# Patient Record
Sex: Male | Born: 1970 | Race: White | Hispanic: No | Marital: Married | Smoking: Never smoker
Health system: Southern US, Community
[De-identification: ages and names within clinical notes are randomized; demographics above are authoritative.]

## PROBLEM LIST (undated history)

## (undated) DIAGNOSIS — E878 Other disorders of electrolyte and fluid balance, not elsewhere classified: Secondary | ICD-10-CM

---

## 2013-09-07 DIAGNOSIS — E785 Hyperlipidemia, unspecified: Secondary | ICD-10-CM | POA: Insufficient documentation

## 2015-03-25 ENCOUNTER — Emergency Department
Admission: EM | Admit: 2015-03-25 | Discharge: 2015-03-25 | Disposition: A | Discharge status: Home / Self Care | Payer: BLUE CROSS/BLUE SHIELD | Attending: Emergency Medicine | Admitting: Emergency Medicine

## 2015-03-25 ENCOUNTER — Emergency Department: Payer: BLUE CROSS/BLUE SHIELD

## 2015-03-25 ENCOUNTER — Encounter: Payer: Self-pay | Admitting: Emergency Medicine

## 2015-03-25 DIAGNOSIS — R002 Palpitations: Secondary | ICD-10-CM | POA: Diagnosis present

## 2015-03-25 DIAGNOSIS — I493 Ventricular premature depolarization: Secondary | ICD-10-CM | POA: Insufficient documentation

## 2015-03-25 HISTORY — DX: Other disorders of electrolyte and fluid balance, not elsewhere classified: E87.8

## 2015-03-25 LAB — COMPREHENSIVE METABOLIC PANEL
ALBUMIN: 4.8 g/dL (ref 3.5–5.0)
ALT: 44 U/L (ref 17–63)
AST: 28 U/L (ref 15–41)
Alkaline Phosphatase: 98 U/L (ref 38–126)
Anion gap: 6 (ref 5–15)
BILIRUBIN TOTAL: 0.9 mg/dL (ref 0.3–1.2)
BUN: 16 mg/dL (ref 6–20)
CHLORIDE: 103 mmol/L (ref 101–111)
CO2: 29 mmol/L (ref 22–32)
Calcium: 9.5 mg/dL (ref 8.9–10.3)
Creatinine, Ser: 0.8 mg/dL (ref 0.61–1.24)
GFR calc Af Amer: >60 mL/min (ref >60)
GFR calc non Af Amer: >60 mL/min (ref >60)
GLUCOSE: 94 mg/dL (ref 65–99)
POTASSIUM: 3.8 mmol/L (ref 3.5–5.1)
SODIUM: 138 mmol/L (ref 135–145)
Total Protein: 7.5 g/dL (ref 6.5–8.1)

## 2015-03-25 LAB — CBC WITH DIFFERENTIAL/PLATELET
BASOS ABS: 0 10*3/uL (ref 0–0.1)
BASOS PCT: 0 %
EOS ABS: 0.1 10*3/uL (ref 0–0.7)
Eosinophils Relative: 1 %
HEMATOCRIT: 44.5 % (ref 40.0–52.0)
Hemoglobin: 15.3 g/dL (ref 13.0–18.0)
Lymphocytes Relative: 19 %
Lymphs Abs: 1.3 10*3/uL (ref 1.0–3.6)
MCH: 30.2 pg (ref 26.0–34.0)
MCHC: 34.3 g/dL (ref 32.0–36.0)
MCV: 88.1 fL (ref 80.0–100.0)
MONO ABS: 0.5 10*3/uL (ref 0.2–1.0)
Monocytes Relative: 8 %
NEUTROS ABS: 4.9 10*3/uL (ref 1.4–6.5)
Neutrophils Relative %: 72 %
PLATELETS: 242 10*3/uL (ref 150–440)
RBC: 5.05 MIL/uL (ref 4.40–5.90)
RDW: 13 % (ref 11.5–14.5)
WBC: 6.8 10*3/uL (ref 3.8–10.6)

## 2015-03-25 LAB — MAGNESIUM: Magnesium: 2.4 mg/dL (ref 1.7–2.4)

## 2015-03-25 LAB — TROPONIN I: Troponin I: <0.03 ng/mL (ref <0.031)

## 2015-03-25 LAB — TSH: TSH: 1.118 u[IU]/mL (ref 0.350–4.500)

## 2015-03-25 NOTE — ED Provider Notes (Signed)
Time Seen: Approximately 1650 I have reviewed the triage notes  Chief Complaint: Palpitations   History of Present Illness: Lawrence Morris is a 45 y.o. male who states that he noticed some heart palpitations last week and then they seemed to slow down over the weekend and then increased today again. He states it feels like he skips a beat. He denies any chest pain, shortness of breath, nausea, vomiting. He denies any arm, jaw, neck pain. He denies any feelings of lightheadedness or syncopal episode. No pulmonary emboli. He states he's been able do his normal day-to-day activities. He states he feels a little anxious but is not taking any medication at this time. He denies any heavy caffeine usage, cocaine, or any other cardiac stimulants at this time. He states he was seen and evaluated in his hometown for possible arrhythmia approximately 4 years ago. He states that evaluation overall was negative and the symptoms seemed to resolve at that time.   Past Medical History  Diagnosis Date  . Hyperchloremia     There are no active problems to display for this patient.   History reviewed. No pertinent past surgical history.  History reviewed. No pertinent past surgical history.  No current outpatient prescriptions on file.  Allergies:  Review of patient's allergies indicates no known allergies.  Family History: History reviewed. No pertinent family history.  Social History: Social History  Substance Use Topics  . Smoking status: Never Smoker   . Smokeless tobacco: None  . Alcohol Use: None     Review of Systems:   10 point review of systems was performed and was otherwise negative:  Constitutional: No fever Eyes: No visual disturbances ENT: No sore throat, ear pain Cardiac: No chest pain Respiratory: No shortness of breath, wheezing, or stridor Abdomen: No abdominal pain, no vomiting, No diarrhea Endocrine: No weight loss, No night sweats Extremities: No peripheral  edema, cyanosis Skin: No rashes, easy bruising Neurologic: No focal weakness, trouble with speech or swollowing Urologic: No dysuria, Hematuria, or urinary frequency   Physical Exam:  ED Triage Vitals  Enc Vitals Group     BP 03/25/15 1558 109/70 mmHg     Pulse Rate 03/25/15 1558 96     Resp 03/25/15 1558 18     Temp 03/25/15 1558 98.2 F (36.8 C)     Temp Source 03/25/15 1558 Oral     SpO2 03/25/15 1558 99 %     Weight 03/25/15 1558 205 lb (92.987 kg)     Height 03/25/15 1558 6\' 2"  (1.88 m)     Head Cir --      Peak Flow --      Pain Score 03/25/15 1600 0     Pain Loc --      Pain Edu? --      Excl. in [place]? --     General: Awake , Alert , and Oriented times 3; GCS 15 Head: Normal cephalic , atraumatic Eyes: Pupils equal , round, reactive to light Nose/Throat: No nasal drainage, patent upper airway without erythema or exudate.  Neck: Supple, Full range of motion, No anterior adenopathy or palpable thyroid masses Lungs: Clear to ascultation without wheezes , rhonchi, or rales Heart: Regular rate, regular rhythm without murmurs , gallops , or rubs Abdomen: Soft, non tender without rebound, guarding , or rigidity; bowel sounds positive and symmetric in all 4 quadrants. No organomegaly .        Extremities: 2 plus symmetric pulses. No edema, clubbing or  cyanosis Neurologic: normal ambulation, Motor symmetric without deficits, sensory intact Skin: warm, dry, no rashes   Labs:   All laboratory work was reviewed including any pertinent negatives or positives listed below:  Labs Reviewed  CBC WITH DIFFERENTIAL/PLATELET  COMPREHENSIVE METABOLIC PANEL  TROPONIN I  MAGNESIUM  TSH   reviewed the patient's laboratory work shows no significant abnormalities  EKG:  ED ECG REPORT I, Daymon Larsen, the attending physician, personally viewed and interpreted this ECG.  Date: 03/25/2015 EKG Time: 1601 Rate: 90 Rhythm: normal sinus rhythm with occasional PVCs QRS Axis:  normal Intervals: normal ST/T Wave abnormalities: normal Conduction Disutrbances: Nonspecified intraventricular delay Narrative Interpretation: unremarkable No Brugada's    Radiology: EXAM: CHEST 2 VIEW  COMPARISON: None.  FINDINGS: The cardiomediastinal contours are normal. There is an 12 mm nodular opacity in the perihilar right midlung zone. Pulmonary vasculature is normal. No consolidation, pleural effusion, or pneumothorax. No acute osseous abnormalities are seen.  IMPRESSION: 1. Right perihilar 12 mm nodular opacity. This may reflect a vessel on end, however is slightly more peripheral than to be expected, recommend chest CT to exclude underlying pulmonary nodule. 2. There is otherwise no acute process   I personally reviewed the radiologic studies     ED Course:  Patient was placed on a continuous cardiac monitor and has some occasional PVCs. He had one couplet while here in emergency department. He is asymptomatic outside of feeling the palpitations. We discussed beta blocker therapy but I felt more appropriate action would be a referral to a cardiologist for an outpatient echocardiogram. Patient's been advised drink plenty of fluids and avoid any stimulants. We discussed anti-anxiety medications etc. but we felt again the cardiology evaluation would be preferred. He is to be of understanding was advised to return here if he has any chest pain feelings of lightheadedness syncopal episode or any new concerns.   Assessment:  Heart palpitations Premature ventricular contractions  Final Clinical Impression:   Final diagnoses:  Heart palpitations     Plan:  Outpatient management Patient was advised to return immediately if condition worsens. Patient was advised to follow up with their primary care physician or other specialized physicians involved in their outpatient care            Daymon Larsen, MD 03/25/15 2000

## 2015-03-25 NOTE — Discharge Instructions (Signed)
Palpitations A palpitation is the feeling that your heartbeat is irregular or is faster than normal. It may feel like your heart is fluttering or skipping a beat. Palpitations are usually not a serious problem. However, in some cases, you may need further medical evaluation. CAUSES  Palpitations can be caused by:  Smoking.  Caffeine or other stimulants, such as diet pills or energy drinks.  Alcohol.  Stress and anxiety.  Strenuous physical activity.  Fatigue.  Certain medicines.  Heart disease, especially if you have a history of irregular heart rhythms (arrhythmias), such as atrial fibrillation, atrial flutter, or supraventricular tachycardia.  An improperly working pacemaker or defibrillator. DIAGNOSIS  To find the cause of your palpitations, your health care provider will take your medical history and perform a physical exam. Your health care provider may also have you take a test called an ambulatory electrocardiogram (ECG). An ECG records your heartbeat patterns over a 24-hour period. You may also have other tests, such as:  Transthoracic echocardiogram (TTE). During echocardiography, sound waves are used to evaluate how blood flows through your heart.  Transesophageal echocardiogram (TEE).  Cardiac monitoring. This allows your health care provider to monitor your heart rate and rhythm in real time.  Holter monitor. This is a portable device that records your heartbeat and can help diagnose heart arrhythmias. It allows your health care provider to track your heart activity for several days, if needed.  Stress tests by exercise or by giving medicine that makes the heart beat faster. TREATMENT  Treatment of palpitations depends on the cause of your symptoms and can vary greatly. Most cases of palpitations do not require any treatment other than time, relaxation, and monitoring your symptoms. Other causes, such as atrial fibrillation, atrial flutter, or supraventricular  tachycardia, usually require further treatment. HOME CARE INSTRUCTIONS   Avoid:  Caffeinated coffee, tea, soft drinks, diet pills, and energy drinks.  Chocolate.  Alcohol.  Stop smoking if you smoke.  Reduce your stress and anxiety. Things that can help you relax include:  A method of controlling things in your body, such as your heartbeats, with your mind (biofeedback).  Yoga.  Meditation.  Physical activity such as swimming, jogging, or walking.  Get plenty of rest and sleep. SEEK MEDICAL CARE IF:   You continue to have a fast or irregular heartbeat beyond 24 hours.  Your palpitations occur more often. SEEK IMMEDIATE MEDICAL CARE IF:  You have chest pain or shortness of breath.  You have a severe headache.  You feel dizzy or you faint. MAKE SURE YOU:  Understand these instructions.  Will watch your condition.  Will get help right away if you are not doing well or get worse.   This information is not intended to replace advice given to you by your health care provider. Make sure you discuss any questions you have with your health care provider.   Document Released: 02/28/2000 Document Revised: 03/07/2013 Document Reviewed: 05/01/2011 Elsevier Interactive Patient Education [employer].  Please return immediately if condition worsens. Please contact her primary physician or the physician you were given for referral. If you have any specialist physicians involved in her treatment and plan please also contact them. Thank you for using [facility].

## 2015-03-25 NOTE — ED Notes (Signed)
MD at bedside. 

## 2015-03-25 NOTE — ED Notes (Signed)
Reports palpatations last week that resolved.  Today started again. Denies chest pain. Mild sob, and diaphoresis.

## 2017-03-05 ENCOUNTER — Ambulatory Visit: Payer: Self-pay | Admitting: Adult Health

## 2017-03-05 ENCOUNTER — Encounter: Payer: Self-pay | Admitting: Adult Health

## 2017-03-05 VITALS — BP 130/80 | HR 99 | Temp 97.6°F | Resp 16 | Ht 74.0 in | Wt 212.0 lb

## 2017-03-05 DIAGNOSIS — J320 Chronic maxillary sinusitis: Secondary | ICD-10-CM

## 2017-03-05 DIAGNOSIS — Z808 Family history of malignant neoplasm of other organs or systems: Secondary | ICD-10-CM

## 2017-03-05 DIAGNOSIS — Z802 Family history of malignant neoplasm of other respiratory and intrathoracic organs: Secondary | ICD-10-CM

## 2017-03-05 MED ORDER — AMOXICILLIN-POT CLAVULANATE 875-125 MG PO TABS: 1.0000 | ORAL_TABLET | Freq: Two times a day (BID) | ORAL | 0 refills | Status: DC

## 2017-03-05 MED ORDER — PREDNISONE 10 MG (21) PO TBPK: ORAL_TABLET | ORAL | 0 refills | Status: DC

## 2017-03-05 NOTE — Patient Instructions (Addendum)
Recommend seeing ENT for evaluation by one month- see primary care for referral or return to this office within a month.  Prednisone tablets What is this medicine? PREDNISONE (PRED ni sone) is a corticosteroid. It is commonly used to treat inflammation of the skin, joints, lungs, and other organs. Common conditions treated include asthma, allergies, and arthritis. It is also used for other conditions, such as blood disorders and diseases of the adrenal glands. This medicine may be used for other purposes; ask your health care provider or pharmacist if you have questions. COMMON BRAND NAME(S): Deltasone, Predone, Sterapred, Sterapred DS What should I tell my health care provider before I take this medicine? They need to know if you have any of these conditions: -Cushing's syndrome -diabetes -glaucoma -heart disease -high blood pressure -infection (especially a virus infection such as chickenpox, cold sores, or herpes) -kidney disease -liver disease -mental illness -myasthenia gravis -osteoporosis -seizures -stomach or intestine problems -thyroid disease -an unusual or allergic reaction to lactose, prednisone, other medicines, foods, dyes, or preservatives -pregnant or trying to get pregnant -breast-feeding How should I use this medicine? Take this medicine by mouth with a glass of water. Follow the directions on the prescription label. Take this medicine with food. If you are taking this medicine once a day, take it in the morning. Do not take more medicine than you are told to take. Do not suddenly stop taking your medicine because you may develop a severe reaction. Your doctor will tell you how much medicine to take. If your doctor wants you to stop the medicine, the dose may be slowly lowered over time to avoid any side effects. Talk to your pediatrician regarding the use of this medicine in children. Special care may be needed. Overdosage: If you think you have taken too much of this  medicine contact a poison control center or emergency room at once. NOTE: This medicine is only for you. Do not share this medicine with others. What if I miss a dose? If you miss a dose, take it as soon as you can. If it is almost time for your next dose, talk to your doctor or health care professional. You may need to miss a dose or take an extra dose. Do not take double or extra doses without advice. What may interact with this medicine? Do not take this medicine with any of the following medications: -metyrapone -mifepristone This medicine may also interact with the following medications: -aminoglutethimide -amphotericin B -aspirin and aspirin-like medicines -barbiturates -certain medicines for diabetes, like glipizide or glyburide -cholestyramine -cholinesterase inhibitors -cyclosporine -digoxin -diuretics -ephedrine -male hormones, like estrogens and birth control pills -isoniazid -ketoconazole -NSAIDS, medicines for pain and inflammation, like ibuprofen or naproxen -phenytoin -rifampin -toxoids -vaccines -warfarin This list may not describe all possible interactions. Give your health care provider a list of all the medicines, herbs, non-prescription drugs, or dietary supplements you use. Also tell them if you smoke, drink alcohol, or use illegal drugs. Some items may interact with your medicine. What should I watch for while using this medicine? Visit your doctor or health care professional for regular checks on your progress. If you are taking this medicine over a prolonged period, carry an identification card with your name and address, the type and dose of your medicine, and your doctor's name and address. This medicine may increase your risk of getting an infection. Tell your doctor or health care professional if you are around anyone with measles or chickenpox, or if you develop sores  or blisters that do not heal properly. If you are going to have surgery, tell your  doctor or health care professional that you have taken this medicine within the last twelve months. Ask your doctor or health care professional about your diet. You may need to lower the amount of salt you eat. This medicine may affect blood sugar levels. If you have diabetes, check with your doctor or health care professional before you change your diet or the dose of your diabetic medicine. What side effects may I notice from receiving this medicine? Side effects that you should report to your doctor or health care professional as soon as possible: -allergic reactions like skin rash, itching or hives, swelling of the face, lips, or tongue -changes in emotions or moods -changes in vision -depressed mood -eye pain -fever or chills, cough, sore throat, pain or difficulty passing urine -increased thirst -swelling of ankles, feet Side effects that usually do not require medical attention (report to your doctor or health care professional if they continue or are bothersome): -confusion, excitement, restlessness -headache -nausea, vomiting -skin problems, acne, thin and shiny skin -trouble sleeping -weight gain This list may not describe all possible side effects. Call your doctor for medical advice about side effects. You may report side effects to FDA at 1-800-FDA-1088. Where should I keep my medicine? Keep out of the reach of children. Store at room temperature between 15 and 30 degrees C (59 and 86 degrees F). Protect from light. Keep container tightly closed. Throw away any unused medicine after the expiration date. NOTE: This sheet is a summary. It may not cover all possible information. If you have questions about this medicine, talk to your doctor, pharmacist, or health care provider.  2018 Elsevier/Gold Standard (2010-10-16 10:57:14) Amoxicillin; Clavulanic Acid chewable tablets What is this medicine? AMOXICILLIN; CLAVULANIC ACID (a mox i SIL in; KLAV yoo lan ic AS id) is a  penicillin antibiotic. It is used to treat certain kinds of bacterial infections. It It will not work for colds, flu, or other viral infections. This medicine may be used for other purposes; ask your health care provider or pharmacist if you have questions. COMMON BRAND NAME(S): Augmentin What should I tell my health care provider before I take this medicine? They need to know if you have any of these conditions: -bowel disease, like colitis -kidney disease -liver disease -mononucleosis -phenylketonuria -an unusual or allergic reaction to amoxicillin, penicillin, cephalosporin, other antibiotics, clavulanic acid, other medicines, foods, dyes, or preservatives -pregnant or trying to get pregnant -breast-feeding How should I use this medicine? Take this medicine by mouth. Chew it completely before swallowing. Follow the directions on the prescription label. Take this medicine at the start of a meal or snack. Take your medicine at regular intervals. Do not take your medicine more often than directed. Take all of your medicine as directed even if you think you are better. Do not skip doses or stop your medicine early. Talk to your pediatrician regarding the use of this medicine in children. While this drug may be prescribed for selected conditions, precautions do apply. Overdosage: If you think you have taken too much of this medicine contact a poison control center or emergency room at once. NOTE: This medicine is only for you. Do not share this medicine with others. What if I miss a dose? If you miss a dose, take it as soon as you can. If it is almost time for your next dose, take only that dose. Do not  take double or extra doses. What may interact with this medicine? -allopurinol -anticoagulants -birth control pills -methotrexate -probenecid This list may not describe all possible interactions. Give your health care provider a list of all the medicines, herbs, non-prescription drugs, or  dietary supplements you use. Also tell them if you smoke, drink alcohol, or use illegal drugs. Some items may interact with your medicine. What should I watch for while using this medicine? Tell your doctor or health care professional if your symptoms do not improve. Do not treat diarrhea with over the counter products. Contact your doctor if you have diarrhea that lasts more than 2 days or if it is severe and watery. If you have diabetes, you may get a false-positive result for sugar in your urine. Check with your doctor or health care professional. Birth control pills may not work properly while you are taking this medicine. Talk to your doctor about using an extra method of birth control. What side effects may I notice from receiving this medicine? Side effects that you should report to your doctor or health care professional as soon as possible: -allergic reactions like skin rash, itching or hives, swelling of the face, lips, or tongue -breathing problems -dark urine -fever or chills, sore throat -redness, blistering, peeling or loosening of the skin, including inside the mouth -seizures -trouble passing urine or change in the amount of urine -unusual bleeding, bruising -unusually weak or tired -white patches or sores in the mouth or throat Side effects that usually do not require medical attention (report to your doctor or health care professional if they continue or are bothersome): -diarrhea -dizziness -headache -nausea, vomiting -stomach upset -vaginal or anal irritation This list may not describe all possible side effects. Call your doctor for medical advice about side effects. You may report side effects to FDA at 1-800-FDA-1088. Where should I keep my medicine? Keep out of the reach of children. Store at room temperature below 25 degrees C (77 degrees F). Keep container tightly closed. Throw away any unused medicine after the expiration date. NOTE: This sheet is a summary. It  may not cover all possible information. If you have questions about this medicine, talk to your doctor, pharmacist, or health care provider.  2018 Elsevier/Gold Standard (2007-05-26 11:38:22)   Sinusitis, Adult Sinusitis is soreness and inflammation of your sinuses. Sinuses are hollow spaces in the bones around your face. They are located:  Around your eyes.  In the middle of your forehead.  Behind your nose.  In your cheekbones.  Your sinuses and nasal passages are lined with a stringy fluid (mucus). Mucus normally drains out of your sinuses. When your nasal tissues get inflamed or swollen, the mucus can get trapped or blocked so air cannot flow through your sinuses. This lets bacteria, viruses, and funguses grow, and that leads to infection. Follow these instructions at home: Medicines  Take, use, or apply over-the-counter and prescription medicines only as told by your doctor. These may include nasal sprays.  If you were prescribed an antibiotic medicine, take it as told by your doctor. Do not stop taking the antibiotic even if you start to feel better. Hydrate and Humidify  Drink enough water to keep your pee (urine) clear or pale yellow.  Use a cool mist humidifier to keep the humidity level in your home above 50%.  Breathe in steam for 10-15 minutes, 3-4 times a day or as told by your doctor. You can do this in the bathroom while a hot shower  is running.  Try not to spend time in cool or dry air. Rest  Rest as much as possible.  Sleep with your head raised (elevated).  Make sure to get enough sleep each night. General instructions  Put a warm, moist washcloth on your face 3-4 times a day or as told by your doctor. This will help with discomfort.  Wash your hands often with soap and water. If there is no soap and water, use hand sanitizer.  Do not smoke. Avoid being around people who are smoking (secondhand smoke).  Keep all follow-up visits as told by your doctor.  This is important. Contact a doctor if:  You have a fever.  Your symptoms get worse.  Your symptoms do not get better within 10 days. Get help right away if:  You have a very bad headache.  You cannot stop throwing up (vomiting).  You have pain or swelling around your face or eyes.  You have trouble seeing.  You feel confused.  Your neck is stiff.  You have trouble breathing. This information is not intended to replace advice given to you by your health care provider. Make sure you discuss any questions you have with your health care provider. Document Released: 08/19/2007 Document Revised: 10/27/2015 Document Reviewed: 12/26/2014 Elsevier Interactive Patient Education  [employer].

## 2017-03-05 NOTE — Progress Notes (Signed)
Subjective:     Patient ID: Lawrence Morris, male   DOB: November 17, 1970, 46 y.o.   MRN: [id number]  Patient is a 46 year old male in no acute distress who presents to the clinic with sinus pain left maxillary area, and sinus drainage. Mild chest congestion present x 12 days.  Blood pressure 130/80, pulse 99, temperature 97.6 F (36.4 C), resp. rate 16, height 6\' 2"  (1.88 m), weight 212 lb (96.2 kg), SpO2 96 %.  Patient Active Problem List   Diagnosis Date Noted  . Other and unspecified hyperlipidemia 09/07/2013      Current Outpatient Medications:  .  atorvastatin (LIPITOR) 10 MG tablet, Take by mouth., Disp: , Rfl:  .  loratadine-pseudoephedrine (CLARITIN-D 24 HOUR) 10-240 MG 24 hr tablet, Take by mouth., Disp: , Rfl:  .  triamcinolone (NASACORT) 55 MCG/ACT AERO nasal inhaler, Place into the nose., Disp: , Rfl:  Sinusitis  This is a new (cold x over one week. ) problem. The current episode started 1 to 4 weeks ago. The problem has been gradually worsening since onset. Associated symptoms include coughing (green discharge nose ) and ear pain (left ear ). Pertinent negatives include no chills, congestion, diaphoresis, headaches, hoarse voice, neck pain, shortness of breath, sinus pressure (bilateral), sneezing, sore throat or swollen glands. Past treatments include oral decongestants. The treatment provided mild relief.   When asked if he has had repeat sinus infections - he reports many as a child and some as adult. Provider recommended seeing ENT. After recommendation he reports brother has brother was diagnosed  metastatic sinus malignancy   Review of Systems  Constitutional: Negative for chills and diaphoresis.  HENT: Positive for ear pain (left ear ). Negative for congestion, hoarse voice, sinus pressure (bilateral), sneezing and sore throat.   Respiratory: Positive for cough (green discharge nose ). Negative for shortness of breath.   Musculoskeletal: Negative for neck pain.    Neurological: Negative for headaches.       Objective:   Physical Exam  Constitutional: He is oriented to person, place, and time. Vital signs are normal. He appears well-developed and well-nourished. He is active.  Non-toxic appearance. He does not have a sickly appearance. He does not appear ill. No distress.  HENT:  Head: Normocephalic and atraumatic.  Right Ear: Hearing, external ear and ear canal normal. A middle ear effusion is present.  Left Ear: Hearing, external ear and ear canal normal. A middle ear effusion is present.  Nose: Mucosal edema and rhinorrhea present. Nasal deformity: history of left deviated septum - unable to visualize per patient report  Right sinus exhibits no maxillary sinus tenderness and no frontal sinus tenderness. Left sinus exhibits no maxillary sinus tenderness and no frontal sinus tenderness.  Mouth/Throat: Uvula is midline and mucous membranes are normal. No uvula swelling. Posterior oropharyngeal erythema present. No oropharyngeal exudate, posterior oropharyngeal edema or tonsillar abscesses.    Mild bilateral tonsillar enlargement   Eyes: Conjunctivae are normal. Pupils are equal, round, and reactive to light.  Neck: Normal range of motion. Neck supple.  Cardiovascular: Normal rate, regular rhythm, normal heart sounds and intact distal pulses. Exam reveals no gallop and no friction rub.  No murmur heard. Pulmonary/Chest: Effort normal and breath sounds normal. No respiratory distress. He has no wheezes. He has no rales. He exhibits no tenderness.  Abdominal: Soft.  Musculoskeletal: Normal range of motion.  Neurological: He is alert and oriented to person, place, and time. He has normal reflexes.  Skin: Skin  is warm. He is not diaphoretic.  Psychiatric: He has a normal mood and affect. His behavior is normal. Judgment and thought content normal.  Vitals reviewed.       Assessment:     Maxillary sinusitis, unspecified chronicity - Plan:  Ambulatory referral to ENT  Family history of malignant neoplasm of maxillary sinus - Plan: Ambulatory referral to ENT      Plan:     Orders Placed This Encounter  Procedures  . Ambulatory referral to ENT    Referral Priority:   Urgent    Referral Type:   Consultation    Referral Reason:   Specialty Services Required    Referred to Provider:   Beverly Gust, MD    Requested Specialty:   Otolaryngology    Number of Visits Requested:   1    Meds ordered this encounter  Medications  . amoxicillin-clavulanate (AUGMENTIN) 875-125 MG tablet    Sig: Take 1 tablet by mouth 2 (two) times daily.    Dispense:  20 tablet    Refill:  0  . predniSONE (STERAPRED UNI-PAK 21 TAB) 10 MG (21) TBPK tablet    Sig: 6 tablets  day one,  5 tablets day two , 4 tablets day three, 3 tablets day four, Take 2 tablets day five Take 1 tablet day six    Dispense:  21 tablet    Refill:  0   Advised needs evaluation by ENT - refferal placed.   Advised to return to clinic for an appointment if no improvement within 72 hours or if any symptoms change or worsen. Advised ER or urgent Care if after hours or on weekend. 911 for emergency symptoms at any time.      Patient verbalized understanding of all instructions given and denies any further questions at this time.

## 2017-11-29 IMAGING — CR DG CHEST 2V
2 series · 2 of 2 positions shown · non-contrast
Comparison: None.

CLINICAL DATA: Heart palpitations. Onset last week that resolved,
started again today.

EXAM:
CHEST  2 VIEW

[chest pa]
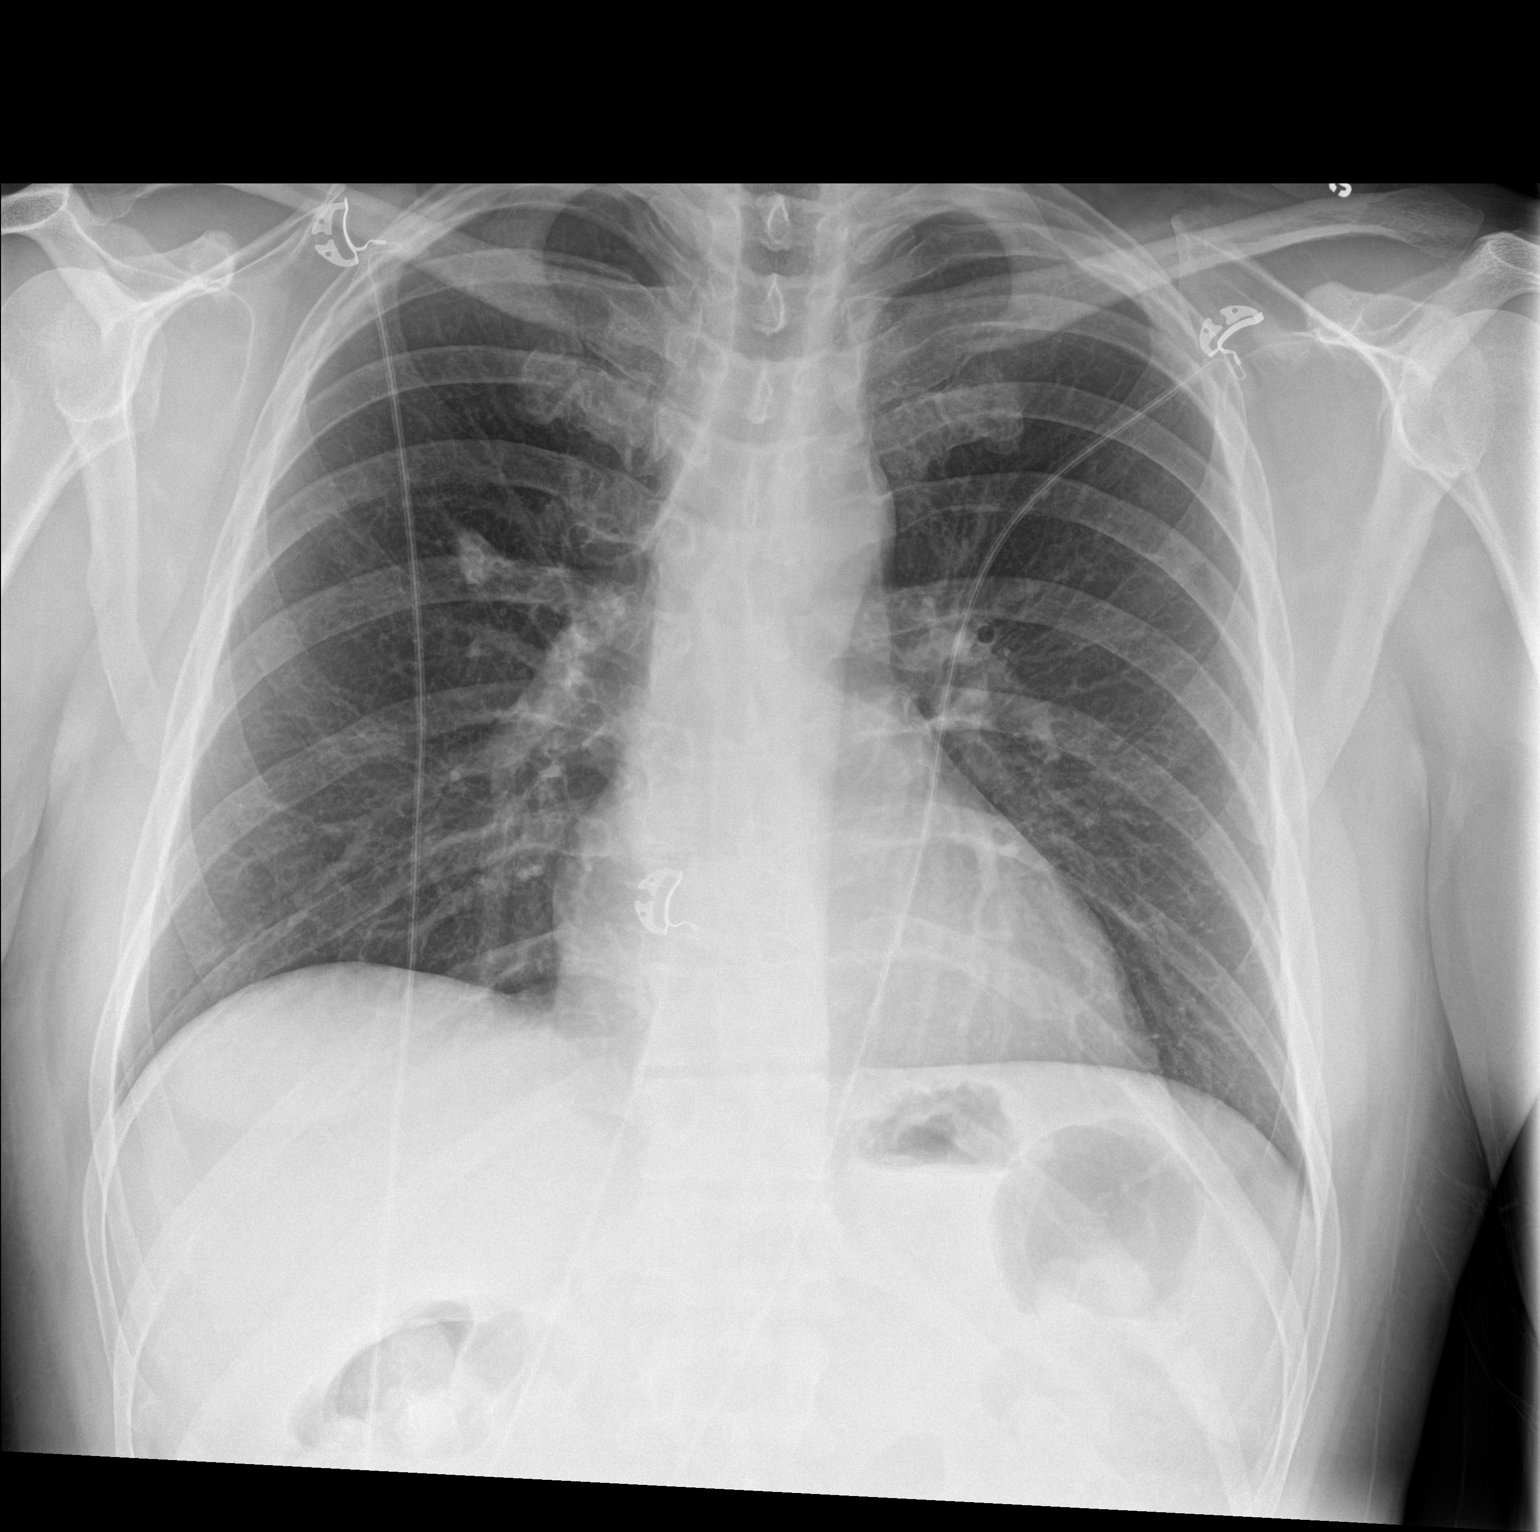

[chest lat]
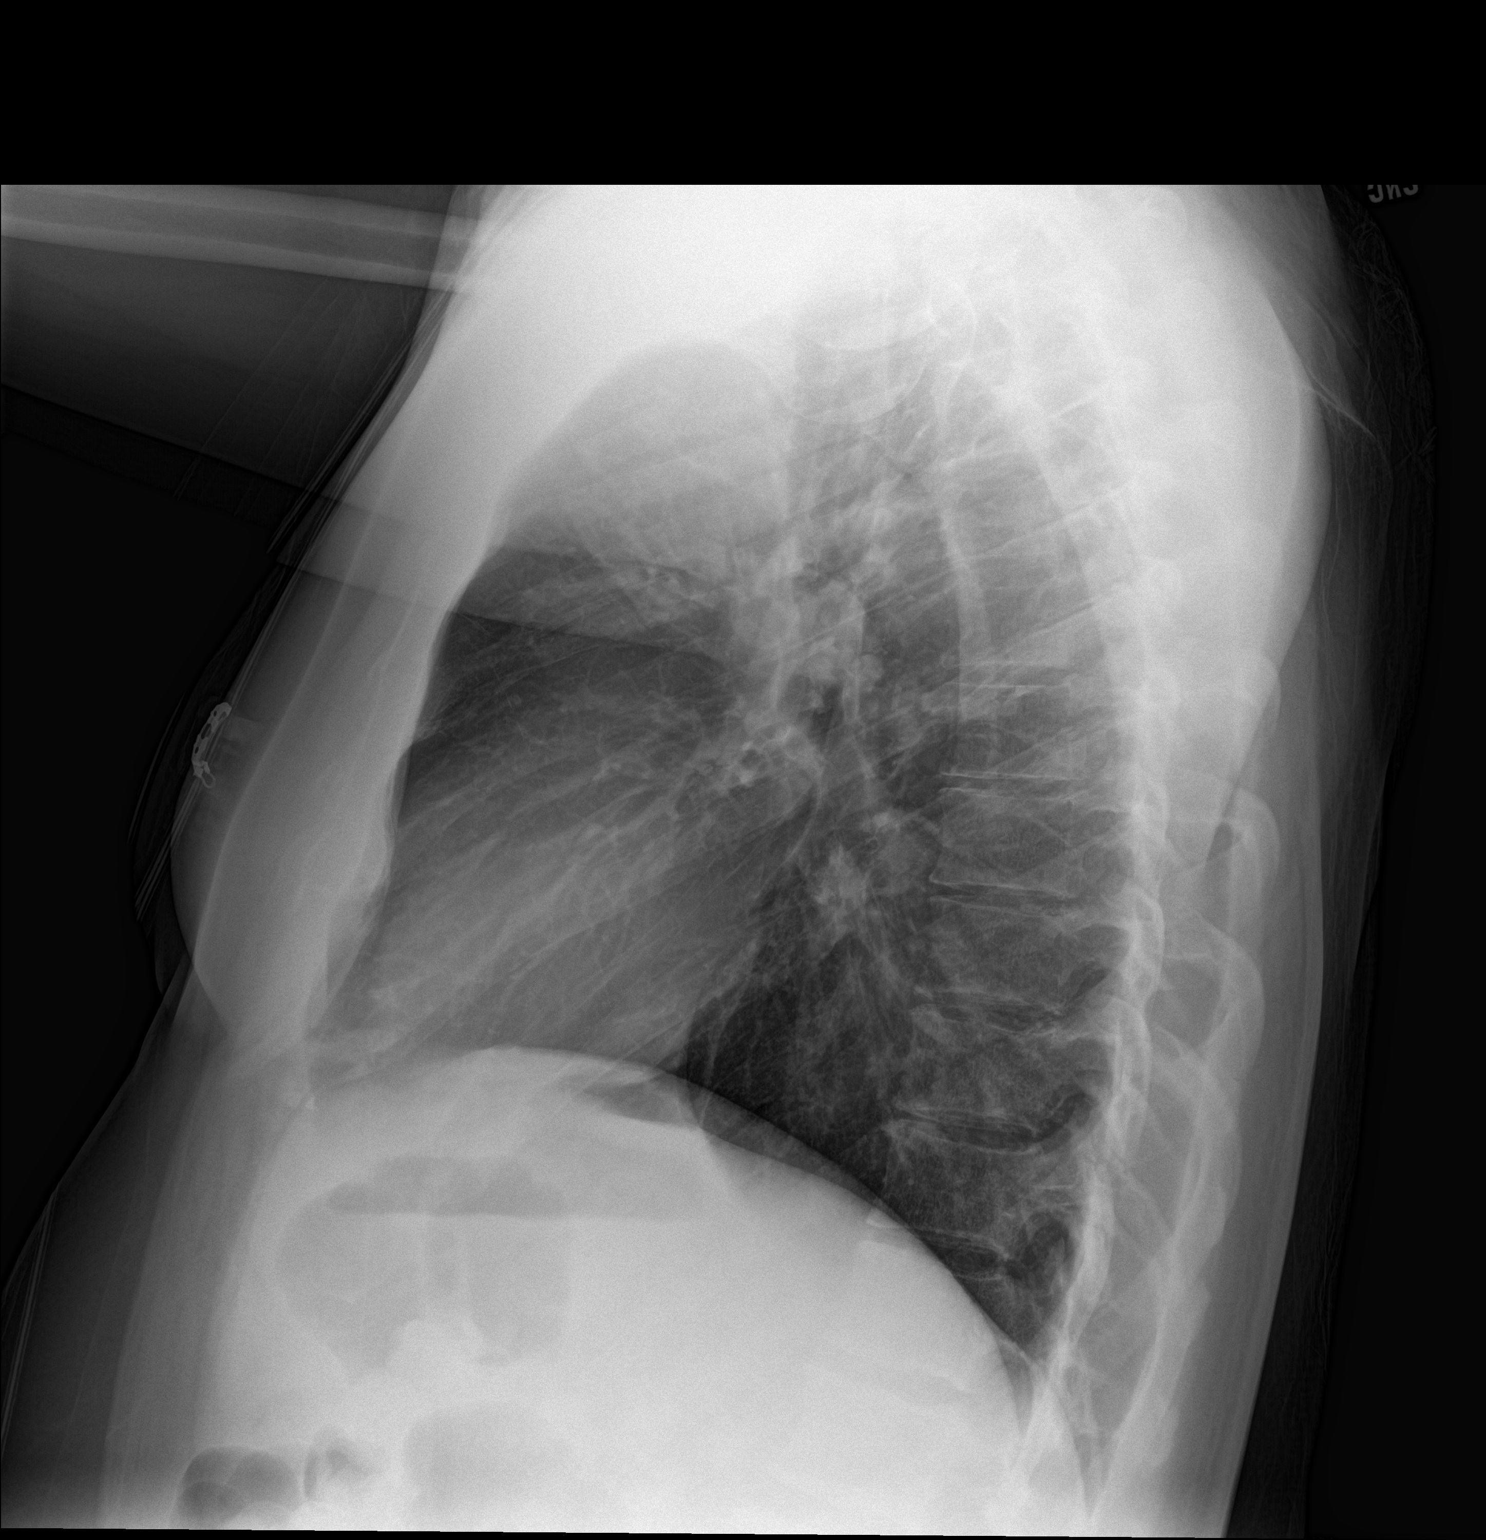

[2 of 2 positions shown; findings below may reference images not displayed]

FINDINGS: The cardiomediastinal contours are normal. There is an 12 mm nodular
opacity in the perihilar right midlung zone. Pulmonary vasculature
is normal. No consolidation, pleural effusion, or pneumothorax. No
acute osseous abnormalities are seen.
IMPRESSION: 1. Right perihilar 12 mm nodular opacity. This may reflect a vessel
on end, however is slightly more peripheral than to be expected,
recommend chest CT to exclude underlying pulmonary nodule.
2. There is otherwise no acute process.

## 2018-12-15 ENCOUNTER — Other Ambulatory Visit: Payer: Self-pay

## 2018-12-15 ENCOUNTER — Ambulatory Visit: Payer: Self-pay

## 2018-12-15 DIAGNOSIS — Z23 Encounter for immunization: Secondary | ICD-10-CM

## 2020-06-10 ENCOUNTER — Other Ambulatory Visit: Payer: Self-pay

## 2020-06-10 ENCOUNTER — Encounter: Payer: Self-pay | Admitting: Medical

## 2020-06-10 ENCOUNTER — Ambulatory Visit: Payer: Self-pay | Admitting: Medical

## 2020-06-10 VITALS — BP 140/84 | HR 83 | Temp 98.0°F | Resp 16 | Wt 216.8 lb

## 2020-06-10 DIAGNOSIS — R21 Rash and other nonspecific skin eruption: Secondary | ICD-10-CM

## 2020-06-10 NOTE — Patient Instructions (Signed)
Sh

## 2020-06-10 NOTE — Progress Notes (Signed)
   Subjective:    Patient ID: Lawrence Morris, male    DOB: Feb 22, 1971, 50 y.o.   MRN: [id number]  HPI 50 yo male in non acute distress. Tried Hydrocorisone cream with some relief but not complete resolution.. Not painful , mildly itchy in the am. Denies fever or chills.    Blood pressure 140/84, pulse 83, temperature 98 F (36.7 C), temperature source Oral, resp. rate 16, weight 216 lb 12.8 oz (98.3 kg), SpO2 97 %.  No Known Allergies  Review of Systems  Eyes: Positive for itching (mild). Negative for photophobia, pain, discharge, redness and visual disturbance.       Objective:   Physical Exam Constitutional:      Appearance: Normal appearance.  HENT:     Head: Normocephalic and atraumatic.  Eyes:     General: Vision grossly intact. Gaze aligned appropriately. No scleral icterus.       Right eye: No discharge.        Left eye: No discharge.     Extraocular Movements: Extraocular movements intact.     Conjunctiva/sclera: Conjunctivae normal.     Pupils: Pupils are equal, round, and reactive to light.   Musculoskeletal:     Cervical back: Normal range of motion.  Neurological:     General: No focal deficit present.     Mental Status: He is alert and oriented to person, place, and time.  Psychiatric:        Mood and Affect: Mood normal.        Behavior: Behavior normal.        Thought Content: Thought content normal.        Judgment: Judgment normal.       Left upper lash and lid area raised slightly scaly mild erythema, no acute sign of infection noted.    Assessment & Plan:  Rash on left upper lid Will refer to [facility] for further evaluation. Patient in agreement with plan. He is traveling this week.  Referral Orders     Ambulatory referral to Dermatology He verbalizes understanding and has no questions at the discharge.

## 2021-09-04 ENCOUNTER — Encounter: Payer: Self-pay | Admitting: Dermatology

## 2021-09-04 ENCOUNTER — Ambulatory Visit: Payer: BC Managed Care – PPO | Admitting: Dermatology

## 2021-09-04 DIAGNOSIS — D2372 Other benign neoplasm of skin of left lower limb, including hip: Secondary | ICD-10-CM

## 2021-09-04 DIAGNOSIS — L578 Other skin changes due to chronic exposure to nonionizing radiation: Secondary | ICD-10-CM | POA: Diagnosis not present

## 2021-09-04 DIAGNOSIS — D485 Neoplasm of uncertain behavior of skin: Secondary | ICD-10-CM

## 2021-09-04 MED ORDER — MUPIROCIN 2 % EX OINT: TOPICAL_OINTMENT | CUTANEOUS | 0 refills | Status: DC

## 2021-09-04 NOTE — Progress Notes (Signed)
   New Patient Visit  Subjective  Lawrence Morris is a 51 y.o. male who presents for the following: Irregular skin lesion (L post calf - has been there for years, occasionally is very itchy. Has used OTC HC cream which hasn't helped).  The following portions of the chart were reviewed this encounter and updated as appropriate:   Tobacco  Allergies  Meds  Problems  Med Hx  Surg Hx  Fam Hx      Review of Systems:  No other skin or systemic complaints except as noted in HPI or Assessment and Plan.  Objective  Well appearing patient in no apparent distress; mood and affect are within normal limits.  A focused examination was performed including the face and L lower leg. Relevant physical exam findings are noted in the Assessment and Plan.  L post calf 1.4 cm scaly pink plaque       Assessment & Plan  Neoplasm of uncertain behavior of skin L post calf  Skin / nail biopsy Type of biopsy: tangential   Informed consent: discussed and consent obtained   Timeout: patient name, date of birth, surgical site, and procedure verified   Procedure prep:  Patient was prepped and draped in usual sterile fashion Prep type:  Isopropyl alcohol Anesthesia: the lesion was anesthetized in a standard fashion   Anesthetic:  1% lidocaine w/ epinephrine 1-100,000 buffered w/ 8.4% NaHCO3 Instrument used: flexible razor blade   Hemostasis achieved with: pressure, aluminum chloride and electrodesiccation   Outcome: patient tolerated procedure well   Post-procedure details: sterile dressing applied and wound care instructions given   Dressing type: bandage and petrolatum    mupirocin ointment (BACTROBAN) 2 % Apply to healing wound BID until healed.  Specimen 1 - Surgical pathology Differential Diagnosis: D48.5 r/o SCCIS vs psoriasis vs other Check Margins: No  R/O early SCCIS >> tinea or psoriasis  Start Mupirocin 2% ointment to aa's BID until healed.    Dermatofibroma - L pretibial -  Firm pink/brown papulenodule with dimple sign - Benign appearing - Call for any changes  Actinic Damage - chronic, secondary to cumulative UV radiation exposure/sun exposure over time - diffuse scaly erythematous macules with underlying dyspigmentation - Recommend daily broad spectrum sunscreen SPF 30+ to sun-exposed areas, reapply every 2 hours as needed.  - Recommend staying in the shade or wearing long sleeves, sun glasses (UVA+UVB protection) and wide brim hats (4-inch brim around the entire circumference of the hat). - Call for new or changing lesions.  Lentigines - Scattered tan macules - Due to sun exposure - Benign-appering, observe - Recommend daily broad spectrum sunscreen SPF 30+ to sun-exposed areas, reapply every 2 hours as needed. - Call for any changes  Return for FBSE .  Luther Redo, CMA, am acting as scribe for Forest Gleason, MD .  Documentation: I have reviewed the above documentation for accuracy and completeness, and I agree with the above.  Forest Gleason, MD

## 2021-09-04 NOTE — Patient Instructions (Addendum)
Wound Care Instructions  Cleanse wound gently with soap and water once a day then pat dry with clean gauze. Apply a thing coat of Petrolatum (petroleum jelly, "Vaseline") over the wound (unless you have an allergy to this). We recommend that you use a new, sterile tube of Vaseline. Do not pick or remove scabs. Do not remove the yellow or white "healing tissue" from the base of the wound.  Cover the wound with fresh, clean, nonstick gauze and secure with paper tape. You may use Band-Aids in place of gauze and tape if the would is small enough, but would recommend trimming much of the tape off as there is often too much. Sometimes Band-Aids can irritate the skin.  You should call the office for your biopsy report after 1 week if you have not already been contacted.  If you experience any problems, such as abnormal amounts of bleeding, swelling, significant bruising, significant pain, or evidence of infection, please call the office immediately.  FOR ADULT SURGERY PATIENTS: If you need something for pain relief you may take 1 extra strength Tylenol (acetaminophen) AND 2 Ibuprofen (200mg each) together every 4 hours as needed for pain. (do not take these if you are allergic to them or if you have a reason you should not take them.) Typically, you may only need pain medication for 1 to 3 days.    Due to recent changes in healthcare laws, you may see results of your pathology and/or laboratory studies on MyChart before the doctors have had a chance to review them. We understand that in some cases there may be results that are confusing or concerning to you. Please understand that not all results are received at the same time and often the doctors may need to interpret multiple results in order to provide you with the best plan of care or course of treatment. Therefore, we ask that you please give us 2 business days to thoroughly review all your results before contacting the office for clarification. Should we  see a critical lab result, you will be contacted sooner.   If You Need Anything After Your Visit  If you have any questions or concerns for your doctor, please call our main line at [phone] and press option 4 to reach your doctor's medical assistant. If no one answers, please leave a voicemail as directed and we will return your call as soon as possible. Messages left after 4 pm will be answered the following business day.   You may also send us a message via MyChart. We typically respond to MyChart messages within 1-2 business days.  For prescription refills, please ask your pharmacy to contact our office. Our fax number is [phone].  If you have an urgent issue when the clinic is closed that cannot wait until the next business day, you can page your doctor at the number below.    Please note that while we do our best to be available for urgent issues outside of office hours, we are not available 24/7.   If you have an urgent issue and are unable to reach us, you may choose to seek medical care at your doctor's office, retail clinic, urgent care center, or emergency room.  If you have a medical emergency, please immediately call 911 or go to the emergency department.  Pager Numbers  - Dr. Kowalski: [phone]  - Dr. Moye: [phone]  - Dr. Stewart: [phone]  In the event of inclement weather, please call our main line at [phone]   for an update on the status of any delays or closures.  Dermatology Medication Tips: Please keep the boxes that topical medications come in in order to help keep track of the instructions about where and how to use these. Pharmacies typically print the medication instructions only on the boxes and not directly on the medication tubes.   If your medication is too expensive, please contact our office at [phone] option 4 or send us a message through MyChart.   We are unable to tell what your co-pay for medications will be in advance  as this is different depending on your insurance coverage. However, we may be able to find a substitute medication at lower cost or fill out paperwork to get insurance to cover a needed medication.   If a prior authorization is required to get your medication covered by your insurance company, please allow us 1-2 business days to complete this process.  Drug prices often vary depending on where the prescription is filled and some pharmacies may offer cheaper prices.  The website www.goodrx.com contains coupons for medications through different pharmacies. The prices here do not account for what the cost may be with help from insurance (it may be cheaper with your insurance), but the website can give you the price if you did not use any insurance.  - You can print the associated coupon and take it with your prescription to the pharmacy.  - You may also stop by our office during regular business hours and pick up a GoodRx coupon card.  - If you need your prescription sent electronically to a different pharmacy, notify our office through [facility] MyChart or by phone at [phone] option 4.     Si Usted Necesita Algo Despus de Su Visita  Tambin puede enviarnos un mensaje a travs de MyChart. Por lo general respondemos a los mensajes de MyChart en el transcurso de 1 a 2 das hbiles.  Para renovar recetas, por favor pida a su farmacia que se ponga en contacto con nuestra oficina. Nuestro nmero de fax es el [phone].  Si tiene un asunto urgente cuando la clnica est cerrada y que no puede esperar hasta el siguiente da hbil, puede llamar/localizar a su doctor(a) al nmero que aparece a continuacin.   Por favor, tenga en cuenta que aunque hacemos todo lo posible para estar disponibles para asuntos urgentes fuera del horario de oficina, no estamos disponibles las 24 horas del da, los 7 das de la semana.   Si tiene un problema urgente y no puede comunicarse con nosotros, puede optar  por buscar atencin mdica  en el consultorio de su doctor(a), en una clnica privada, en un centro de atencin urgente o en una sala de emergencias.  Si tiene una emergencia mdica, por favor llame inmediatamente al 911 o vaya a la sala de emergencias.  Nmeros de bper  - Dr. Kowalski: [phone]  - Dra. Moye: [phone]  - Dra. Stewart: [phone]  En caso de inclemencias del tiempo, por favor llame a nuestra lnea principal al [phone] para una actualizacin sobre el estado de cualquier retraso o cierre.  Consejos para la medicacin en dermatologa: Por favor, guarde las cajas en las que vienen los medicamentos de uso tpico para ayudarle a seguir las instrucciones sobre dnde y cmo usarlos. Las farmacias generalmente imprimen las instrucciones del medicamento slo en las cajas y no directamente en los tubos del medicamento.   Si su medicamento es muy caro, por favor, pngase en contacto con nuestra   oficina llamando al [phone] y presione la opcin 4 o envenos un mensaje a travs de MyChart.   No podemos decirle cul ser su copago por los medicamentos por adelantado ya que esto es diferente dependiendo de la cobertura de su seguro. Sin embargo, es posible que podamos encontrar un medicamento sustituto a menor costo o llenar un formulario para que el seguro cubra el medicamento que se considera necesario.   Si se requiere una autorizacin previa para que su compaa de seguros cubra su medicamento, por favor permtanos de 1 a 2 das hbiles para completar este proceso.  Los precios de los medicamentos varan con frecuencia dependiendo del lugar de dnde se surte la receta y alguna farmacias pueden ofrecer precios ms baratos.  El sitio web www.goodrx.com tiene cupones para medicamentos de diferentes farmacias. Los precios aqu no tienen en cuenta lo que podra costar con la ayuda del seguro (puede ser ms barato con su seguro), pero el sitio web puede darle el precio si  no utiliz ningn seguro.  - Puede imprimir el cupn correspondiente y llevarlo con su receta a la farmacia.  - Tambin puede pasar por nuestra oficina durante el horario de atencin regular y recoger una tarjeta de cupones de GoodRx.  - Si necesita que su receta se enve electrnicamente a una farmacia diferente, informe a nuestra oficina a travs de MyChart de [facility] o por telfono llamando al [phone] y presione la opcin 4.  

## 2021-09-10 ENCOUNTER — Telehealth: Payer: Self-pay

## 2021-09-10 NOTE — Telephone Encounter (Signed)
Discussed biopsy results with pt  °

## 2021-09-10 NOTE — Telephone Encounter (Addendum)
Tried calling patient regarding bx results. No answer. LMOM for patient to call office.     ----- Message from Alfonso Patten, MD sent at 09/10/2021  9:03 AM EDT ----- Skin , L post calf SEBORRHEIC KERATOSIS, INFLAMED   This is a benign growth or "wisdom spot" with inflammation. No skin cancer was seen. If it is itchy or bothersome, would recommend freezing in clinic at follow-up, but no additional treatment is needed if it is not bothersome.  MAs please call. Thank you!

## 2021-09-10 NOTE — Telephone Encounter (Signed)
-----   Message from Alfonso Patten, MD sent at 09/10/2021  9:03 AM EDT ----- Skin , L post calf SEBORRHEIC KERATOSIS, INFLAMED   This is a benign growth or "wisdom spot" with inflammation. No skin cancer was seen. If it is itchy or bothersome, would recommend freezing in clinic at follow-up, but no additional treatment is needed if it is not bothersome.  MAs please call. Thank you!

## 2021-11-13 ENCOUNTER — Ambulatory Visit: Payer: BC Managed Care – PPO | Admitting: Dermatology

## 2021-11-13 DIAGNOSIS — Z1283 Encounter for screening for malignant neoplasm of skin: Secondary | ICD-10-CM | POA: Diagnosis not present

## 2021-11-13 DIAGNOSIS — L814 Other melanin hyperpigmentation: Secondary | ICD-10-CM

## 2021-11-13 DIAGNOSIS — D2371 Other benign neoplasm of skin of right lower limb, including hip: Secondary | ICD-10-CM

## 2021-11-13 DIAGNOSIS — L821 Other seborrheic keratosis: Secondary | ICD-10-CM

## 2021-11-13 DIAGNOSIS — I781 Nevus, non-neoplastic: Secondary | ICD-10-CM | POA: Diagnosis not present

## 2021-11-13 DIAGNOSIS — L578 Other skin changes due to chronic exposure to nonionizing radiation: Secondary | ICD-10-CM

## 2021-11-13 DIAGNOSIS — L570 Actinic keratosis: Secondary | ICD-10-CM | POA: Diagnosis not present

## 2021-11-13 DIAGNOSIS — Z808 Family history of malignant neoplasm of other organs or systems: Secondary | ICD-10-CM

## 2021-11-13 DIAGNOSIS — D229 Melanocytic nevi, unspecified: Secondary | ICD-10-CM

## 2021-11-13 DIAGNOSIS — D1800 Hemangioma unspecified site: Secondary | ICD-10-CM

## 2021-11-13 NOTE — Progress Notes (Signed)
Follow-Up Visit   Subjective  Lawrence Morris is a 51 y.o. male who presents for the following: Annual Exam. The patient presents for Total-Body Skin Exam (TBSE) for skin cancer screening and mole check.  The patient has spots, moles and lesions to be evaluated, some may be new or changing.  The following portions of the chart were reviewed this encounter and updated as appropriate:   Tobacco  Allergies  Meds  Problems  Med Hx  Surg Hx  Fam Hx      Review of Systems:  No other skin or systemic complaints except as noted in HPI or Assessment and Plan.  Objective  Well appearing patient in no apparent distress; mood and affect are within normal limits.  A full examination was performed including scalp, head, eyes, ears, nose, lips, neck, chest, axillae, abdomen, back, buttocks, bilateral upper extremities, bilateral lower extremities, hands, feet, fingers, toes, fingernails, and toenails. All findings within normal limits unless otherwise noted below.  R arm Dilated blood vessels.  R anti helix Erythematous thin papules/macules with gritty scale.     Assessment & Plan  Telangiectasia R arm  Benign-appearing.  Observation.  Call clinic for new or changing lesions.  Recommend daily use of broad spectrum spf 30+ sunscreen to sun-exposed areas.    AK (actinic keratosis) R anti helix  Actinic keratoses are precancerous spots that appear secondary to cumulative UV radiation exposure/sun exposure over time. They are chronic with expected duration over 1 year. A portion of actinic keratoses will progress to squamous cell carcinoma of the skin. It is not possible to reliably predict which spots will progress to skin cancer and so treatment is recommended to prevent development of skin cancer.  Recommend daily broad spectrum sunscreen SPF 30+ to sun-exposed areas, reapply every 2 hours as needed.  Recommend staying in the shade or wearing long sleeves, sun glasses (UVA+UVB  protection) and wide brim hats (4-inch brim around the entire circumference of the hat). Call for new or changing lesions.  Prior to procedure, discussed risks of blister formation, small wound, skin dyspigmentation, or rare scar following cryotherapy. Recommend Vaseline ointment to treated areas while healing.   Destruction of lesion - R anti helix Complexity: simple   Destruction method: cryotherapy   Informed consent: discussed and consent obtained   Timeout:  patient name, date of birth, surgical site, and procedure verified Lesion destroyed using liquid nitrogen: Yes   Region frozen until ice ball extended beyond lesion: Yes   Outcome: patient tolerated procedure well with no complications   Post-procedure details: wound care instructions given     Lentigines - Scattered tan macules - Due to sun exposure - Benign-appearing, observe - Recommend daily broad spectrum sunscreen SPF 30+ to sun-exposed areas, reapply every 2 hours as needed. - Call for any changes  Seborrheic Keratoses - Stuck-on, waxy, tan-brown papules and/or plaques  - Benign-appearing - Discussed benign etiology and prognosis. - Observe - Call for any changes  Melanocytic Nevi - Tan-brown and/or pink-flesh-colored symmetric macules and papules - Benign appearing on exam today - Observation - Call clinic for new or changing moles - Recommend daily use of broad spectrum spf 30+ sunscreen to sun-exposed areas.   Hemangiomas - Red papules - Discussed benign nature - Observe - Call for any changes  Actinic Damage - Chronic condition, secondary to cumulative UV/sun exposure - diffuse scaly erythematous macules with underlying dyspigmentation - Recommend daily broad spectrum sunscreen SPF 30+ to sun-exposed areas, reapply every 2  hours as needed.  - Staying in the shade or wearing long sleeves, sun glasses (UVA+UVB protection) and wide brim hats (4-inch brim around the entire circumference of the hat) are  also recommended for sun protection.  - Call for new or changing lesions.  Dermatofibroma - R lat foot  - Firm pink/brown papulenodule with dimple sign - Benign appearing - Call for any changes  Family history of skin cancer - what type(s): unknown  - who affected: father  Skin cancer screening performed today.  Return in about 1 year (around 11/14/2022) for TBSE.  Luther Redo, CMA, am acting as scribe for Forest Gleason, MD .  Documentation: I have reviewed the above documentation for accuracy and completeness, and I agree with the above.  Forest Gleason, MD

## 2021-11-13 NOTE — Patient Instructions (Addendum)

## 2021-11-28 ENCOUNTER — Encounter: Payer: Self-pay | Admitting: Dermatology

## 2022-02-02 ENCOUNTER — Ambulatory Visit (INDEPENDENT_AMBULATORY_CARE_PROVIDER_SITE_OTHER): Payer: Self-pay | Admitting: Physician Assistant

## 2022-02-02 ENCOUNTER — Encounter: Payer: Self-pay | Admitting: Physician Assistant

## 2022-02-02 ENCOUNTER — Other Ambulatory Visit: Payer: Self-pay

## 2022-02-02 VITALS — BP 108/84 | HR 75 | Temp 96.4°F | Wt 215.0 lb

## 2022-02-02 DIAGNOSIS — H9311 Tinnitus, right ear: Secondary | ICD-10-CM

## 2022-02-02 NOTE — Progress Notes (Signed)
[facility] [address], [place] [address]   Office Visit Note  Patient Name: Lawrence Morris Date of Birth [id number]  Medical Record number [id number]  Date of Service: 02/02/2022  Chief Complaint  Patient presents with   Ear Pain    Right ear the worse, ringing sound started a couple of weeks ago, no use of drops, no pain      51 y/o M presents to the clinic for c/o "ringing in the R ear" x 2 weeks. Has had ringing in the ear for the past 5-6 months, but more consistently over the past 2 weeks. Pt denies nausea, vomiting, dizziness. He denies recent open water activities. No air travel recently. No photosensitivity. No recent head or neck trauma. +h/o multiple ear infections and tubes placed in the ears for the same reason. He denies recent loud noise exposure.h/o allergies for which he used Claritin-D. No h/o cold water immersion ie diving. No h/o TMJ. No changes in medicines.       Current Medication:  Outpatient Encounter Medications as of 02/02/2022  Medication Sig   atorvastatin (LIPITOR) 10 MG tablet Take by mouth.   [DISCONTINUED] loratadine-pseudoephedrine (CLARITIN-D 24 HOUR) 10-240 MG 24 hr tablet Take by mouth. (Patient not taking: Reported on 11/13/2021)   [DISCONTINUED] mupirocin ointment (BACTROBAN) 2 % Apply to healing wound BID until healed. (Patient not taking: Reported on 11/13/2021)   [DISCONTINUED] triamcinolone (NASACORT) 55 MCG/ACT AERO nasal inhaler Place into the nose. (Patient not taking: Reported on 09/04/2021)   No facility-administered encounter medications on file as of 02/02/2022.      Medical History: Past Medical History:  Diagnosis Date   Hyperchloremia      Vital Signs: BP 108/84   Pulse 75   Temp (!) 96.4 F (35.8 C) (Tympanic)   Wt 215 lb (97.5 kg)   SpO2 97%   BMI 27.60 kg/m    Review of Systems  Constitutional: Negative.   HENT: Negative.    Respiratory: Negative.    Cardiovascular: Negative.    Gastrointestinal: Negative.   Neurological: Negative.     Physical Exam Constitutional:      Appearance: Normal appearance.  HENT:     Head: Atraumatic.     Right Ear: Ear canal and external ear normal.     Left Ear: Tympanic membrane, ear canal and external ear normal.     Ears:     Comments: R TM-tympanosclerosis present    Nose: Nose normal.     Mouth/Throat:     Mouth: Mucous membranes are moist.     Pharynx: Oropharynx is clear.  Eyes:     Extraocular Movements: Extraocular movements intact.  Cardiovascular:     Rate and Rhythm: Normal rate and regular rhythm.  Pulmonary:     Effort: Pulmonary effort is normal.     Breath sounds: Normal breath sounds.  Musculoskeletal:     Cervical back: Neck supple.  Skin:    General: Skin is warm.  Neurological:     Mental Status: He is alert.  Psychiatric:        Mood and Affect: Mood normal.        Behavior: Behavior normal.        Thought Content: Thought content normal.        Judgment: Judgment normal.       Assessment/Plan:  1. Tinnitus of right ear  Reviewed my clinical findings with patient. Pt will continue to monitor ear symptoms and if any  new onset of symptoms of the ear then patient will contact us and will consider a treatment with oral steroids. Briefly discussed ENT referral for further evaluation and management. Discussed use of nasal decongestant ie Afrin for max of 3 consecutive days.  Briefly discussed Rx for SSRIs to help with tinnitus.  Pt is scheduled to see his PCP for his annual physical later in the year and will discuss this with his provider if it still continues. Pt verbalized understanding and in agreement.     General Counseling: Lawrence Morris understanding of the findings of todays visit and agrees with plan of treatment. I have discussed any further diagnostic evaluation that may be needed or ordered today. We also reviewed his medications today. he has been encouraged to call the  office with any questions or concerns that should arise related to todays visit.    Time spent:[address] [place], [place] Physician Assistant

## 2024-03-21 ENCOUNTER — Other Ambulatory Visit: Payer: Self-pay

## 2024-03-21 ENCOUNTER — Ambulatory Visit (INDEPENDENT_AMBULATORY_CARE_PROVIDER_SITE_OTHER): Payer: Self-pay | Admitting: Medical

## 2024-03-21 ENCOUNTER — Encounter: Payer: Self-pay | Admitting: Medical

## 2024-03-21 VITALS — BP 141/95 | HR 78 | Temp 96.7°F | Ht 74.0 in | Wt 210.0 lb

## 2024-03-21 DIAGNOSIS — M62830 Muscle spasm of back: Secondary | ICD-10-CM

## 2024-03-21 MED ORDER — CYCLOBENZAPRINE HCL 10 MG PO TABS: 10.0000 mg | ORAL_TABLET | Freq: Three times a day (TID) | ORAL | 0 refills | Status: AC | PRN

## 2024-03-21 NOTE — Progress Notes (Signed)
 [facility] [address] [place], [place] [address]   Office Visit Note  Patient Name: Lawrence Morris Date of Birth [id number]  Medical Record number [id number]  Date of Service: 03/21/2024  Chief Complaint  Patient presents with   Back Pain    Patient states he began having lower left-sided back pain last week after a long meeting however it improved over time. Back became aggravated after sitting in the same chair again after being in another long meeting. He feels a knot in his back. He has been alternating Aleve and Tylenol. He applied ice for 1-2 days after initial onset and then switched to a warm heating pad. He applied ice again last ngiht and this morning.     HPI Discussed the use of AI scribe software for clinical note transcription with the patient, who gave verbal consent to proceed.  History of Present Illness Lawrence Morris is a 54 year old male who presents with acute back pain.  Acute back pain - Onset approximately one week ago during a work meeting - Localized to a specific spot on the left side of the back, described as a knot - Pain sometimes radiates down the center through the groin, with a sensation of 'someone pulling at my left testicle' - No numbness, tingling, or weakness in the legs - No issues with urination or bowel control - No testicular swelling/lump or hematuria - Pain worsened after attending another meeting in the same room and seated in same chair yesterday - Exacerbated by bending over, tying shoes, turning, and sitting up from a lying position - Particularly severe in the morning, causing stiffness - Stretching sometimes alleviates discomfort, but certain movements can trigger pain  Prior episodes of back pain - Similar episode approximately one year ago, exact location not remembered - Similar issue approximately ten years ago, treated with cyclobenzaprine  with some relief  Response to treatment - Initial  improvement with ice, heating pad, and stretching - Recently using Aleve and Tylenol, unsure of effectiveness   Current Medication:  Outpatient Encounter Medications as of 03/21/2024  Medication Sig   atorvastatin (LIPITOR) 10 MG tablet Take by mouth.   cyclobenzaprine  (FLEXERIL ) 10 MG tablet Take 1 tablet (10 mg total) by mouth 3 (three) times daily as needed for muscle spasms.   loratadine-pseudoephedrine (CLARITIN-D 24-HOUR) 10-240 MG 24 hr tablet Take 1 tablet by mouth daily.   No facility-administered encounter medications on file as of 03/21/2024.      Medical History: Past Medical History:  Diagnosis Date   Hyperchloremia      Vital Signs: BP (!) 141/95 Comment: standing due to pain in lower back  Pulse 78   Temp (!) 96.7 F (35.9 C)   Ht 6' 2 (1.88 m)   Wt 210 lb (95.3 kg)   SpO2 97%   BMI 26.96 kg/m    Review of Systems  Constitutional:  Positive for activity change (Mildly limited due to pain). Negative for chills and fever.  Gastrointestinal:  Negative for abdominal pain.  Genitourinary:  Positive for testicular pain (occasional radiating from back). Negative for decreased urine volume, dysuria, flank pain, hematuria, scrotal swelling and urgency.  Musculoskeletal:  Positive for back pain. Negative for gait problem, neck pain and neck stiffness.  Neurological:  Negative for weakness and numbness.    Physical Exam Vitals reviewed.  Constitutional:      General: He is not in acute distress.    Comments: Mildly uncomfortable, prefers standing  over sitting due to back pain.  HENT:     Head: Normocephalic.  Musculoskeletal:     Cervical back: Neck supple. No swelling, deformity or signs of trauma. Normal range of motion.     Thoracic back: No deformity, signs of trauma, tenderness or bony tenderness.     Lumbar back: Spasms (left lumbar paraspinal muscles) and tenderness (moderate to left lumbar paraspinal muscles) present. No swelling, edema, deformity,  signs of trauma or bony tenderness. Decreased range of motion (forward and lateral flexion mildly decreased secondary to pain).     Comments: Pain reproduced with forward flexion at lumbar spine.  Neurological:     Mental Status: He is alert.     Gait: Gait is intact.     Assessment/Plan: 1. Spasm of muscle of lower back (Primary)  - cyclobenzaprine  (FLEXERIL ) 10 MG tablet; Take 1 tablet (10 mg total) by mouth 3 (three) times daily as needed for muscle spasms.  Dispense: 30 tablet; Refill: 0    Assessment & Plan Acute low back pain with muscle spasm Pain localized to left side, sometimes radiating to groin, worsened by movement at low back (flexion primarily). No associated neurological symptoms. Suspect muscle spasm and possible strain. - Prescribed cyclobenzaprine  to take PRN muscle pain/spasm. Cautioned regarding potential drowsiness, especially if take during daytime. Dispensed from pharmacy. - May continue Ibuprofen 400-600 mg with food every 6-8 hours as needed for pain. - Advised heating pad for relief. - Recommended stretching and self-massage techniques. - Discussed regular massage therapy for potential avoidance of future flares. - Patient encouraged to monitor symptoms and contact provider/schedule follow up visit as needed for new/worsening/persistent symptoms.   General Counseling: drayton tieu understanding of the findings of todays visit and plan of treatment. he has been encouraged to call the office with any questions or concerns that should arise related to todays visit.    Time spent:20 Minutes    Joen Arts PA-C Physician Assistant

## 2024-03-26 NOTE — Progress Notes (Incomplete)
 [facility] [address] [place], [place] [address]   Office Visit Note  Patient Name: Lawrence Morris Date of Birth [id number]  Medical Record number [id number]  Date of Service: 03/21/2024  Chief Complaint  Patient presents with   Back Pain    Patient states he began having lower left-sided back pain last week after a long meeting however it improved over time. Back became aggravated after sitting in the same chair again after being in another long meeting. He feels a knot in his back. He has been alternating Aleve and Tylenol. He applied ice for 1-2 days after initial onset and then switched to a warm heating pad. He applied ice again last ngiht and this morning.     HPI Discussed the use of AI scribe software for clinical note transcription with the patient, who gave verbal consent to proceed.  History of Present Illness Lawrence Morris is a 54 year old male who presents with acute back pain.  Acute back pain - Onset approximately one week ago during a work meeting - Localized to a specific spot on the left side of the back, described as a knot - Pain sometimes radiates down the center through the groin, with a sensation of 'someone pulling at my left testicle' - No numbness, tingling, or weakness in the legs - No issues with urination or bowel control - No testicular swelling/lump or hematuria - Pain worsened after attending another meeting in the same room and seated in same chair yesterday - Exacerbated by bending over, tying shoes, turning, and sitting up from a lying position - Particularly severe in the morning, causing stiffness - Stretching sometimes alleviates discomfort, but certain movements can trigger pain  Prior episodes of back pain - Similar episode approximately one year ago, exact location not remembered - Similar issue approximately ten years ago, treated with cyclobenzaprine  with some relief  Response to treatment - Initial  improvement with ice, heating pad, and stretching - Recently using Aleve and Tylenol, unsure of effectiveness   Current Medication:  Outpatient Encounter Medications as of 03/21/2024  Medication Sig   atorvastatin (LIPITOR) 10 MG tablet Take by mouth.   cyclobenzaprine  (FLEXERIL ) 10 MG tablet Take 1 tablet (10 mg total) by mouth 3 (three) times daily as needed for muscle spasms.   loratadine-pseudoephedrine (CLARITIN-D 24-HOUR) 10-240 MG 24 hr tablet Take 1 tablet by mouth daily.   No facility-administered encounter medications on file as of 03/21/2024.      Medical History: Past Medical History:  Diagnosis Date   Hyperchloremia      Vital Signs: BP (!) 141/95 Comment: standing due to pain in lower back  Pulse 78   Temp (!) 96.7 F (35.9 C)   Ht 6' 2 (1.88 m)   Wt 210 lb (95.3 kg)   SpO2 97%   BMI 26.96 kg/m    Review of Systems  Constitutional:  Positive for activity change (Mildly limited due to pain). Negative for chills and fever.  Gastrointestinal:  Negative for abdominal pain.  Genitourinary:  Positive for testicular pain (occasional radiating from back). Negative for decreased urine volume, dysuria, flank pain, hematuria, scrotal swelling and urgency.  Musculoskeletal:  Positive for back pain. Negative for gait problem, neck pain and neck stiffness.  Neurological:  Negative for weakness and numbness.    Physical Exam Vitals reviewed.  Constitutional:      General: He is not in acute distress.    Comments: Mildly uncomfortable, prefers standing  over sitting due to back pain.  HENT:     Head: Normocephalic.  Musculoskeletal:     Cervical back: Neck supple. No swelling, deformity or signs of trauma. Normal range of motion.     Thoracic back: No deformity, signs of trauma, tenderness or bony tenderness.     Lumbar back: Spasms (left lumbar paraspinal muscles) and tenderness (moderate to left lumbar paraspinal muscles) present. No swelling, edema, deformity,  signs of trauma or bony tenderness. Decreased range of motion (forward and lateral flexion mildly decreased secondary to pain).     Comments: Pain reproduced with forward flexion at lumbar spine.  Neurological:     Mental Status: He is alert.     Gait: Gait is intact.     Assessment/Plan: 1. Spasm of muscle of lower back (Primary)  - cyclobenzaprine  (FLEXERIL ) 10 MG tablet; Take 1 tablet (10 mg total) by mouth 3 (three) times daily as needed for muscle spasms.  Dispense: 30 tablet; Refill: 0    Assessment & Plan Acute low back pain with muscle spasm Pain localized to left side, sometimes radiating to groin, worsened by movement at low back (flexion primarily). No associated neurological symptoms. Suspect muscle spasm and possible strain. - Prescribed cyclobenzaprine  to take PRN muscle pain/spasm. Cautioned regarding potential drowsiness, especially if take during daytime. Dispensed from pharmacy. - May continue Ibuprofen 400-600 mg with food every 6-8 hours as needed for pain. - Advised heating pad for relief. - Recommended stretching and self-massage techniques. - Discussed regular massage therapy for potential avoidance of future flares. - Patient encouraged to monitor symptoms and contact provider/schedule follow up visit as needed for new/worsening/persistent symptoms.   General Counseling: drayton tieu understanding of the findings of todays visit and plan of treatment. he has been encouraged to call the office with any questions or concerns that should arise related to todays visit.    Time spent:20 Minutes    Joen Arts PA-C Physician Assistant

## 2024-03-27 NOTE — Patient Instructions (Signed)
-  Take Cyclobenzaprine  as often as every 8 hours as needed for muscle spasm/pain. This medication is likely to cause drowsiness. Avoid driving or other high risk activities until you know how the medication will affect you. If the 10 mg dose is too strong (causes too much drowsiness), let me know, and I will prescribe 5 mg tablets. -Continue Ibuprofen 400-600 mg with food every 6-8 hours as needed for pain. -Apply a heating pad to your low back for 15-20 minutes at a time on a low-medium setting. -Try gently stretching or massaging your low back. -Consider having regular massages to help avoid future flare ups. Standing up periodically during longer meetings may help avoid stiffness. -Send MyChart message to provider or schedule return visit as needed for new/worsening symptoms (i.e. increased pain, numbness, weakness) or if symptoms do not improve as discussed with prescribed treatment over the next 4-5 days.
